# Patient Record
Sex: Male | Born: 1955 | Race: White | Hispanic: No | Marital: Single | Smoking: Never smoker
Health system: Southern US, Community
[De-identification: ages and names within clinical notes are randomized; demographics above are authoritative.]

## PROBLEM LIST (undated history)

## (undated) DIAGNOSIS — I10 Essential (primary) hypertension: Secondary | ICD-10-CM

## (undated) HISTORY — PX: KNEE SURGERY: SHX244

---

## 2007-10-27 ENCOUNTER — Ambulatory Visit (HOSPITAL_COMMUNITY): Admission: RE | Admit: 2007-10-27 | Discharge: 2007-10-27 | Payer: Self-pay | Admitting: Urology

## 2007-12-08 ENCOUNTER — Ambulatory Visit (HOSPITAL_COMMUNITY): Admission: RE | Admit: 2007-12-08 | Discharge: 2007-12-08 | Payer: Self-pay | Admitting: Urology

## 2012-12-17 ENCOUNTER — Other Ambulatory Visit (HOSPITAL_COMMUNITY): Payer: Self-pay | Admitting: Urology

## 2012-12-17 DIAGNOSIS — N200 Calculus of kidney: Secondary | ICD-10-CM

## 2012-12-21 ENCOUNTER — Ambulatory Visit (HOSPITAL_COMMUNITY)
Admission: RE | Admit: 2012-12-21 | Discharge: 2012-12-21 | Disposition: A | Discharge status: Home / Self Care | Payer: BC Managed Care – PPO | Source: Ambulatory Visit | Attending: Urology | Admitting: Urology

## 2012-12-21 DIAGNOSIS — R911 Solitary pulmonary nodule: Secondary | ICD-10-CM | POA: Insufficient documentation

## 2012-12-21 DIAGNOSIS — N200 Calculus of kidney: Secondary | ICD-10-CM

## 2012-12-21 DIAGNOSIS — K7689 Other specified diseases of liver: Secondary | ICD-10-CM | POA: Insufficient documentation

## 2012-12-21 DIAGNOSIS — R1032 Left lower quadrant pain: Secondary | ICD-10-CM | POA: Insufficient documentation

## 2016-01-06 ENCOUNTER — Encounter (HOSPITAL_COMMUNITY): Payer: Self-pay | Admitting: *Deleted

## 2016-01-06 ENCOUNTER — Emergency Department (HOSPITAL_COMMUNITY): Payer: BLUE CROSS/BLUE SHIELD

## 2016-01-06 DIAGNOSIS — S63280A Dislocation of proximal interphalangeal joint of right index finger, initial encounter: Secondary | ICD-10-CM | POA: Diagnosis not present

## 2016-01-06 DIAGNOSIS — Y998 Other external cause status: Secondary | ICD-10-CM | POA: Diagnosis not present

## 2016-01-06 DIAGNOSIS — S76111A Strain of right quadriceps muscle, fascia and tendon, initial encounter: Secondary | ICD-10-CM | POA: Diagnosis not present

## 2016-01-06 DIAGNOSIS — S6991XA Unspecified injury of right wrist, hand and finger(s), initial encounter: Secondary | ICD-10-CM | POA: Diagnosis present

## 2016-01-06 DIAGNOSIS — Y9351 Activity, roller skating (inline) and skateboarding: Secondary | ICD-10-CM | POA: Insufficient documentation

## 2016-01-06 DIAGNOSIS — S20411A Abrasion of right back wall of thorax, initial encounter: Secondary | ICD-10-CM | POA: Insufficient documentation

## 2016-01-06 DIAGNOSIS — S61411A Laceration without foreign body of right hand, initial encounter: Secondary | ICD-10-CM | POA: Insufficient documentation

## 2016-01-06 DIAGNOSIS — Y92828 Other wilderness area as the place of occurrence of the external cause: Secondary | ICD-10-CM | POA: Diagnosis not present

## 2016-01-06 DIAGNOSIS — I10 Essential (primary) hypertension: Secondary | ICD-10-CM | POA: Insufficient documentation

## 2016-01-06 NOTE — ED Triage Notes (Signed)
Pt was riding a skateboard tonight when he fell, did have helmet on, c/o pain to right index finger, upper back area, abrasions noted to left lower leg, bilateral elbows, right upper thigh area, denies any LOC<

## 2016-01-07 ENCOUNTER — Emergency Department (HOSPITAL_COMMUNITY): Payer: BLUE CROSS/BLUE SHIELD

## 2016-01-07 ENCOUNTER — Emergency Department (HOSPITAL_COMMUNITY)
Admission: EM | Admit: 2016-01-07 | Discharge: 2016-01-07 | Disposition: A | Discharge status: Home / Self Care | Payer: BLUE CROSS/BLUE SHIELD | Attending: Emergency Medicine | Admitting: Emergency Medicine

## 2016-01-07 DIAGNOSIS — S20411A Abrasion of right back wall of thorax, initial encounter: Secondary | ICD-10-CM

## 2016-01-07 DIAGNOSIS — S61411A Laceration without foreign body of right hand, initial encounter: Secondary | ICD-10-CM

## 2016-01-07 DIAGNOSIS — S76111A Strain of right quadriceps muscle, fascia and tendon, initial encounter: Secondary | ICD-10-CM

## 2016-01-07 DIAGNOSIS — S63259A Unspecified dislocation of unspecified finger, initial encounter: Secondary | ICD-10-CM

## 2016-01-07 DIAGNOSIS — W19XXXA Unspecified fall, initial encounter: Secondary | ICD-10-CM

## 2016-01-07 HISTORY — DX: Essential (primary) hypertension: I10

## 2016-01-07 MED ORDER — CYCLOBENZAPRINE HCL 10 MG PO TABS
5.0000 mg | ORAL_TABLET | Freq: Once | ORAL | Status: AC
  Administered 2016-01-07: 5 mg via ORAL
  Filled 2016-01-07: qty 1

## 2016-01-07 MED ORDER — NAPROXEN 500 MG PO TABS: 500.0000 mg | ORAL_TABLET | Freq: Two times a day (BID) | ORAL | 0 refills | Status: AC

## 2016-01-07 MED ORDER — BUPIVACAINE HCL (PF) 0.5 % IJ SOLN
10.0000 mL | Freq: Once | INTRAMUSCULAR | Status: DC
  Filled 2016-01-07: qty 30

## 2016-01-07 MED ORDER — HYDROCODONE-ACETAMINOPHEN 5-325 MG PO TABS: 1.0000 | ORAL_TABLET | Freq: Four times a day (QID) | ORAL | 0 refills | Status: AC | PRN

## 2016-01-07 MED ORDER — NAPROXEN 250 MG PO TABS
500.0000 mg | ORAL_TABLET | Freq: Once | ORAL | Status: AC
  Administered 2016-01-07: 500 mg via ORAL
  Filled 2016-01-07: qty 2

## 2016-01-07 MED ORDER — CEPHALEXIN 500 MG PO CAPS: 500.0000 mg | ORAL_CAPSULE | Freq: Three times a day (TID) | ORAL | 0 refills | Status: AC

## 2016-01-07 MED ORDER — CYCLOBENZAPRINE HCL 5 MG PO TABS
5.0000 mg | ORAL_TABLET | Freq: Three times a day (TID) | ORAL | 0 refills | Status: AC | PRN
Start: 2016-01-07 — End: ?

## 2016-01-07 MED ORDER — CEPHALEXIN 250 MG/5ML PO SUSR
500.0000 mg | Freq: Once | ORAL | Status: AC
  Administered 2016-01-07: 500 mg via ORAL
  Filled 2016-01-07: qty 20

## 2016-01-07 NOTE — ED Provider Notes (Signed)
[facility]Y DEPT Provider Note   CSN: [id number] Arrival date & time: 01/06/16  2301  By signing my name below, I, Derrick Bean, attest that this documentation has been prepared under the direction and in the presence of Derrick Albe, MD . Electronically Signed: Modena Bean, Scribe. 01/07/2016. 12:16 AM.  History   Chief Complaint Chief Complaint  Patient presents with  . Fall   The history is provided by the patient. No language interpreter was used.   HPI Comments: Derrick Bean is a 60 y.o. male who presents to the Emergency Department complaining of a fall that occurred today. He states that fell off a skateboard while riding down a hill with a helmet on. He couldn't figure out how to stop and started gaining speed because he was going downhill.  He reports injuries on multiple areas of his body, including his right index finger, laceration of his right palm, low back stiffness, right thigh pain. He denies any LOC. He states he has constant moderate RIF, Right thigh pain.  He denies any neck pain, abdominal pain.    PCP: Derrick Sprung, MD  Past Medical History:  Diagnosis Date  . Hypertension     There are no active problems to display for this patient.   Past Surgical History:  Procedure Laterality Date  . KNEE SURGERY         Home Medications    lisinopril  Family History No family history on file.  Social History Social History  Substance Use Topics  . Smoking status: Never Smoker  . Smokeless tobacco: Never Used  . Alcohol use No  retired   Allergies   Review of patient's allergies indicates no known allergies.   Review of Systems Review of Systems  Musculoskeletal: Positive for back pain (Upper). Negative for neck pain.  Skin: Positive for wound.  Neurological: Negative for syncope.  All other systems reviewed and are negative.    Physical Exam Updated Vital Signs BP 127/79   Pulse 81   Temp 97.8 F (36.6 C) (Oral)   Resp 20   Ht  6\' 4"  (1.93 m)   Wt 220 lb (99.8 kg)   SpO2 97%   BMI 26.78 kg/m   Vital signs normal    Physical Exam  Constitutional: He is oriented to person, place, and time. He appears well-developed and well-nourished.  Non-toxic appearance. He does not appear ill. No distress.  HENT:  Head: Normocephalic and atraumatic.  Right Ear: External ear normal.  Left Ear: External ear normal.  Nose: Nose normal. No mucosal edema or rhinorrhea.  Mouth/Throat: Oropharynx is clear and moist and mucous membranes are normal. No dental abscesses or uvula swelling.  Eyes: Conjunctivae and EOM are normal. Pupils are equal, round, and reactive to light.  Neck: Normal range of motion and full passive range of motion without pain. Neck supple.  Cardiovascular: Normal rate, regular rhythm and normal heart sounds.  Exam reveals no gallop and no friction rub.   No murmur heard. Pulmonary/Chest: Effort normal and breath sounds normal. No respiratory distress. He has no wheezes. He has no rhonchi. He has no rales. He exhibits no tenderness and no crepitus.  His chest is nontender when I stress his ribs.  Abdominal: Soft. Normal appearance. He exhibits no distension. There is no tenderness.  Musculoskeletal: Normal range of motion. He exhibits tenderness. He exhibits no edema.       Back:       Arms:      Right  hand: He exhibits deformity.       Legs: Deformity of the RIF at the PIP joint. Abrasion over proimal left forearm and proximal lateral LLE, but ROM of joints without pain.  His back is nontender to palpation midline thoracic or lumbar or cervical spine. He has an abrasion as noted however he is nontender to palpation in that area.  Neurological: He is alert and oriented to person, place, and time. He has normal strength. No cranial nerve deficit.  Skin: Skin is warm, dry and intact. No rash noted. No erythema. No pallor.  Abrasions across the lower thoracic area.   1 cm laceration on his right palm around  the MCP of the RIF. Laceration on the volar aspect of right index proximal to the PIP crease.   Psychiatric: He has a normal mood and affect. His speech is normal and behavior is normal. His mood appears not anxious.  Nursing note and vitals reviewed.      ED Treatments / Results      Radiology Dg Hand Complete Right  Result Date: 01/07/2016 CLINICAL DATA:  Skateboarding accident tonight, second finger pain. EXAM: RIGHT HAND - COMPLETE 3+ VIEW COMPARISON:  None. FINDINGS: Dislocation second proximal interphalangeal joint, the middle phalanx is posteriorly displaced with at least 10 mm of overriding. No fracture deformity. No destructive bony lesions. Second digit soft tissue swelling without subcutaneous gas or radiopaque foreign bodies. IMPRESSION: Acute second PIP dislocation without fracture deformity. Electronically Signed   By: Derrick Metroourtnay  Bean M.D.   On: 01/07/2016 00:10   Dg Finger Index Right  Result Date: 01/07/2016 CLINICAL DATA:  Index finger dislocation, postreduction. EXAM: RIGHT INDEX FINGER 2+V COMPARISON:  Pre reduction exam 1 our prior. FINDINGS: Proximal interphalangeal joint dislocation has been reduced. There is minimal residual dorsal shift of the middle phalanx with respect to the proximal phalanx with mild extension. A 3 mm osseous fragment adjacent to the volar aspect of the distal phalanx. This may be a fracture fragment with donor site likely from the middle phalanx versus accessory ossicle. There is soft tissue edema. IMPRESSION: Reduction of index finger PIP dislocation. Small osseous fragment adjacent to the volar articular surface may be a small fracture fragment, with donor site likely from the middle phalanx versus accessory ossicle. Electronically Signed   By: Derrick OaksMelanie  Bean M.D.   On: 01/07/2016 01:02    Procedures Procedures (including critical care time)  Medications Ordered in ED Medications  bupivacaine (MARCAINE) 0.5 % injection 10 mL (not  administered)  naproxen (NAPROSYN) tablet 500 mg (not administered)  cyclobenzaprine (FLEXERIL) tablet 5 mg (not administered)  cephALEXin (KEFLEX) 250 MG/5ML suspension 500 mg (500 mg Oral Given 01/07/16 0106)     Initial Impression / Assessment and Plan / ED Course  I have reviewed the triage vital signs and the nursing notes.  Pertinent labs & imaging results that were available during my care of the patient were reviewed by me and considered in my medical decision making (see chart for details).  Clinical Course   12:42 AM discussed His x-ray which showed a dislocation at the PIP joint of his right index finger. A digital block was performed using Marcaine and 0.5%.  Returned at 12:48 AM and patient's dislocated finger was reduced. He was sent for postreduction x-ray.  Patient's lacerations were sutured by my PA. He was started on Keflex because the dislocation could be opened. After he was sutured he was placed in a finger splint. He was referred to orthopedics.  He was started on anti-inflammatory and muscle relaxers for his general body stiffness and achiness. He was given Norco for severe pain from his dislocation.  NERVE BLOCK Performed by: Ward Givens Consent: Verbal consent obtained. Required items: required blood products, implants, devices, and special equipment available Time out: Immediately prior to procedure a "time out" was called to verify the correct patient, procedure, equipment, support staff and site/side marked as required.  Indication: pain control for procedure Nerve block body site: RIF  Preparation: Patient was prepped and draped in the usual sterile fashion. Needle gauge: 24 G Location technique: anatomical landmarks  Local anesthetic: 0.5% marcaine   Anesthetic total: 4 ml  Outcome: pain improved Patient tolerance: Patient tolerated the procedure well with no immediate complications.   Reduction of dislocation Date/Time: 12:48 AM Performed by:  Ward Givens Authorized by: Ward Givens Consent: Verbal consent obtained. Risks and benefits: risks, benefits and alternatives were discussed Consent given by: patient Required items: required blood products, implants, devices, and special equipment available Time out: Immediately prior to procedure a "time out" was called to verify the correct patient, procedure, equipment, support staff and site/side marked as required.   Vitals: Vital signs were monitored during sedation. Patient tolerance: Patient tolerated the procedure well with no immediate complications. Joint: PIPJ of the RIF Reduction technique: traction    Final Clinical Impressions(s) / ED Diagnoses   Final diagnoses:  Fall, initial encounter  Laceration of right palm, initial encounter  Dislocation, finger, initial encounter  Abrasion of upper back excluding scapular region, right, initial encounter  Quadriceps muscle strain, right, initial encounter    New Prescriptions New Prescriptions   CEPHALEXIN (KEFLEX) 500 MG CAPSULE    Take 1 capsule (500 mg total) by mouth 3 (three) times daily.   CYCLOBENZAPRINE (FLEXERIL) 5 MG TABLET    Take 1 tablet (5 mg total) by mouth 3 (three) times daily as needed.   HYDROCODONE-ACETAMINOPHEN (NORCO/VICODIN) 5-325 MG TABLET    Take 1 tablet by mouth every 6 (six) hours as needed for moderate pain or severe pain.   NAPROXEN (NAPROSYN) 500 MG TABLET    Take 1 tablet (500 mg total) by mouth 2 (two) times daily.   Plan discharge  Derrick Albe, MD, FACEP    I personally performed the services described in this documentation, which was scribed in my presence. The recorded information has been reviewed and considered.  Derrick Albe, MD, Concha Pyo, MD 01/07/16 [phone]

## 2016-01-07 NOTE — Discharge Instructions (Addendum)
Elevate the injured areas. Use ice to keep the swelling down and for comfort. Take the medications as prescribed. Call either Dr Harrison's office or Dr Mina MarbleWeingold (hand [place] call in [place]) to be rechecked early this week. Keep the lacerations clean and dry. You can use triple antibiotic ointment on the wounds. Wear the splint.  Recheck if you see signs of infection in either wound, or you seem worse.

## 2016-01-07 NOTE — ED Provider Notes (Signed)
LACERATION REPAIR OF THE RIGHT INDEX FINGER AND THE RIGHT PALM.  Patient is a 60 year old male who fell off off of a skateboard and sustained a laceration to his right index finger and right hand. I discussed the procedure of repairing the lacerations with the patient in terms which he understands. And he has permission for the procedure.  Patient was identified by arm band. Procedural time out taken before starting the procedure.  The hand was cleansed with safe-cleanse. The laceration of the palmar surface of the right hand was infiltrated with Sensorcaine 0.5%. The wound was irrigated with saline. No foreign body appreciated. No bone or tendon involvement was noted. Following this the wound was repaired using sterile technique with 2 interrupted sutures of 4-0 nylon. The wound measures 1.3 cm.  Sterile field was then used at the right index finger. The wound was again cleansed with safe-cleanse. This site was already anesthetized because of a dislocation of the finger. The wound was explored, it was irrigated with saline. There is no bone or tendon involvement. No foreign body noted. Wound was repaired with 4 interrupted sutures of 4-0 nylon. The patient tolerated the procedure without problem. Dressing and splint were applied by the nursing staff.   Ivery QualeHobson Donnie Panik, PA-C 01/07/16 [id number]    Devoria AlbeIva Knapp, MD 01/07/16 [phone]

## 2016-01-07 NOTE — ED Notes (Signed)
MD at bedside. 

## 2017-04-13 IMAGING — DX DG HAND COMPLETE 3+V*R*
3 series · 3 of 3 positions shown · non-contrast
Comparison: None.

CLINICAL DATA: Skateboarding accident tonight, second finger pain.

EXAM:
RIGHT HAND - COMPLETE 3+ VIEW

[hand pa]
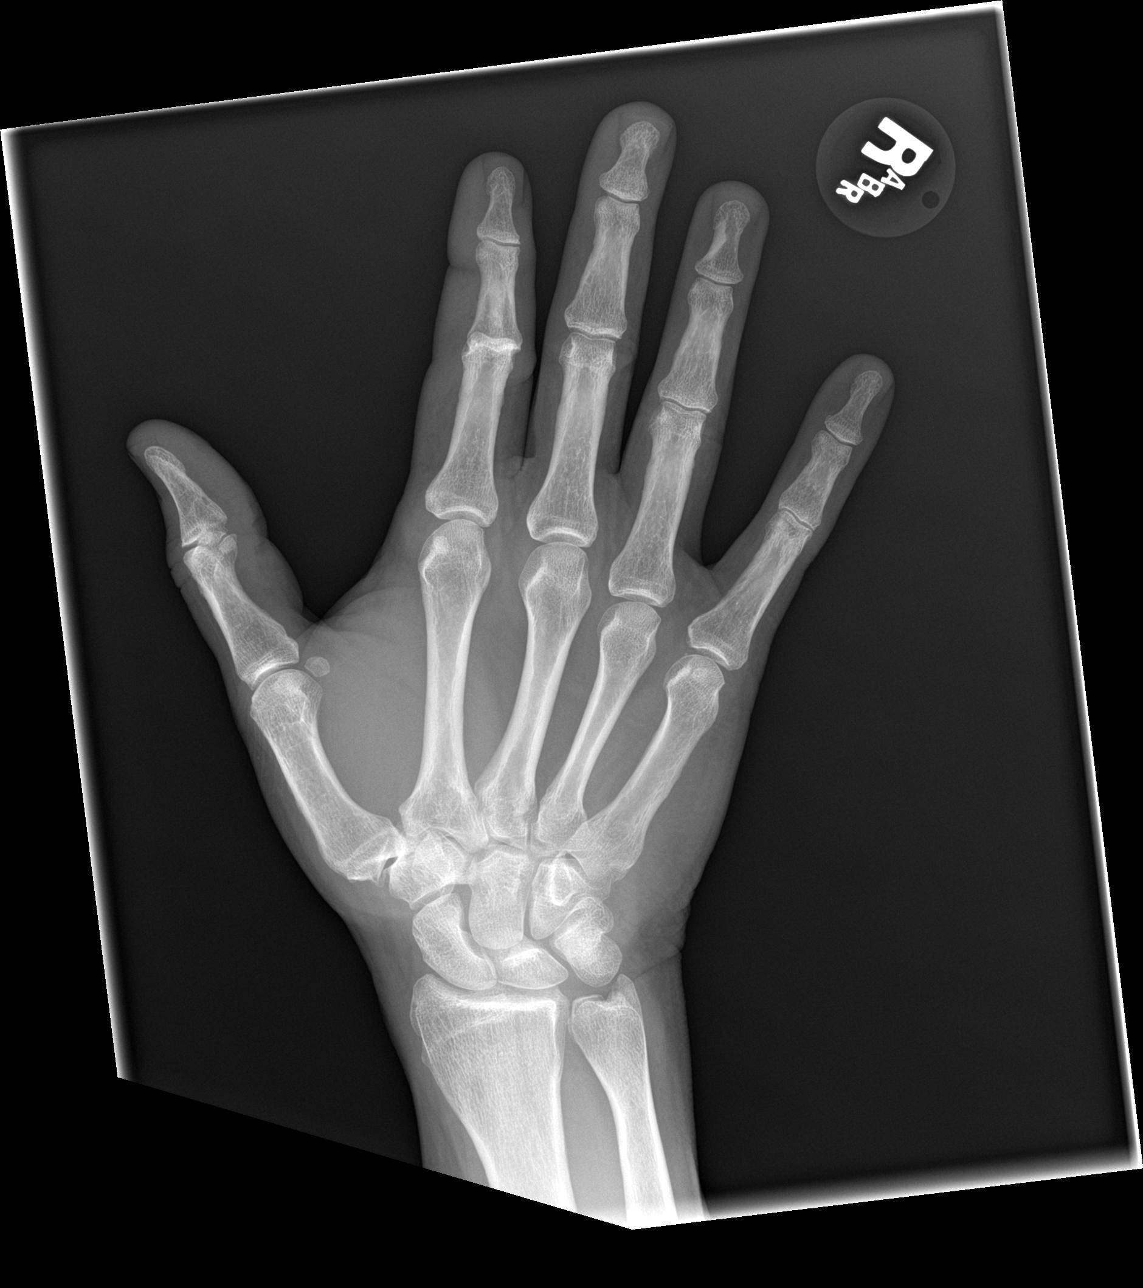

[hand obl]
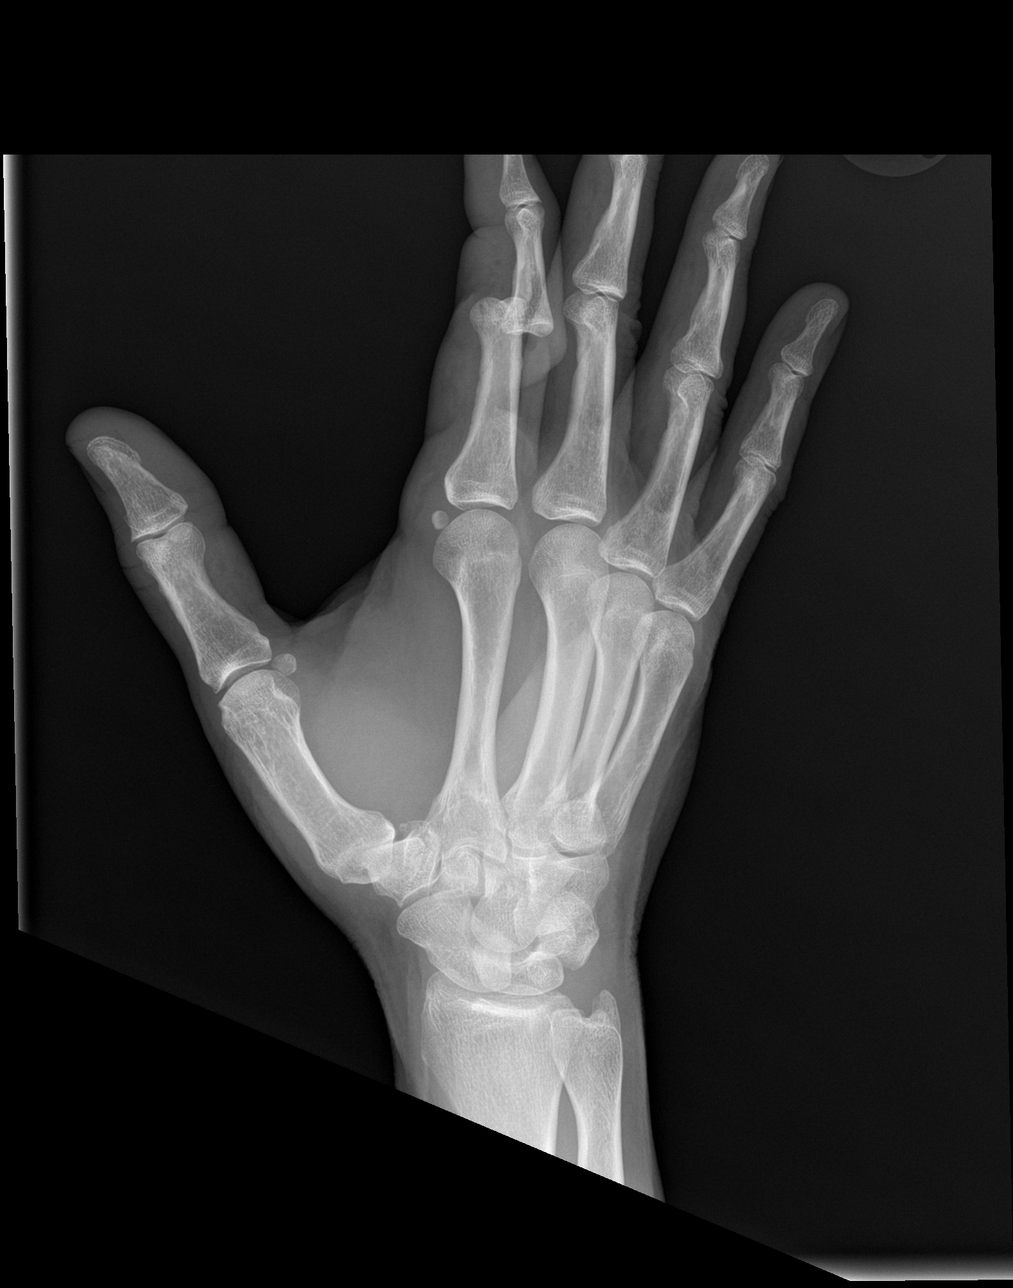

[hand lat]
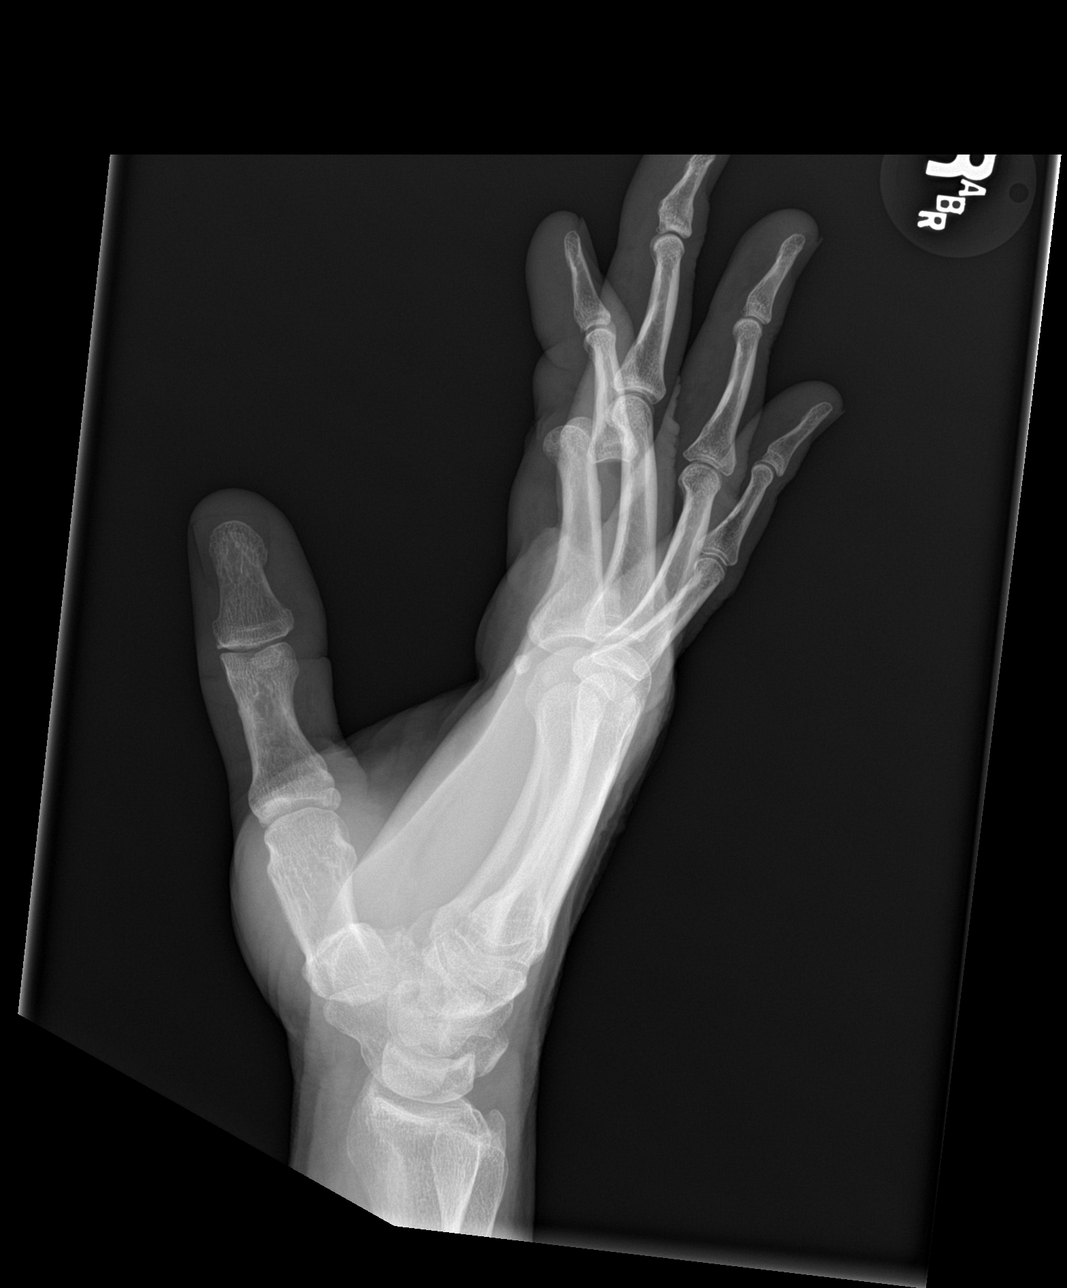

[3 of 3 positions shown; findings below may reference images not displayed]

FINDINGS: Dislocation second proximal interphalangeal joint, the middle
phalanx is posteriorly displaced with at least 10 mm of overriding.
No fracture deformity. No destructive bony lesions. Second digit
soft tissue swelling without subcutaneous gas or radiopaque foreign
bodies.
IMPRESSION: Acute second PIP dislocation without fracture deformity.

## 2019-04-12 DIAGNOSIS — I10 Essential (primary) hypertension: Secondary | ICD-10-CM | POA: Diagnosis not present

## 2019-04-12 DIAGNOSIS — R42 Dizziness and giddiness: Secondary | ICD-10-CM | POA: Diagnosis not present

## 2019-04-12 DIAGNOSIS — K900 Celiac disease: Secondary | ICD-10-CM | POA: Diagnosis not present

## 2019-04-12 DIAGNOSIS — R7301 Impaired fasting glucose: Secondary | ICD-10-CM | POA: Diagnosis not present

## 2019-04-12 DIAGNOSIS — E8881 Metabolic syndrome: Secondary | ICD-10-CM | POA: Diagnosis not present

## 2019-04-12 DIAGNOSIS — R946 Abnormal results of thyroid function studies: Secondary | ICD-10-CM | POA: Diagnosis not present

## 2019-04-12 DIAGNOSIS — R739 Hyperglycemia, unspecified: Secondary | ICD-10-CM | POA: Diagnosis not present

## 2019-05-04 DIAGNOSIS — L570 Actinic keratosis: Secondary | ICD-10-CM | POA: Diagnosis not present

## 2019-12-20 DIAGNOSIS — M7732 Calcaneal spur, left foot: Secondary | ICD-10-CM | POA: Diagnosis not present

## 2019-12-20 DIAGNOSIS — M71572 Other bursitis, not elsewhere classified, left ankle and foot: Secondary | ICD-10-CM | POA: Diagnosis not present

## 2019-12-20 DIAGNOSIS — M722 Plantar fascial fibromatosis: Secondary | ICD-10-CM | POA: Diagnosis not present

## 2019-12-27 DIAGNOSIS — M71572 Other bursitis, not elsewhere classified, left ankle and foot: Secondary | ICD-10-CM | POA: Diagnosis not present

## 2020-01-03 DIAGNOSIS — M71572 Other bursitis, not elsewhere classified, left ankle and foot: Secondary | ICD-10-CM | POA: Diagnosis not present

## 2020-01-03 DIAGNOSIS — M722 Plantar fascial fibromatosis: Secondary | ICD-10-CM | POA: Diagnosis not present

## 2020-01-12 DIAGNOSIS — M722 Plantar fascial fibromatosis: Secondary | ICD-10-CM | POA: Diagnosis not present

## 2020-01-12 DIAGNOSIS — M71572 Other bursitis, not elsewhere classified, left ankle and foot: Secondary | ICD-10-CM | POA: Diagnosis not present

## 2020-01-18 DIAGNOSIS — M722 Plantar fascial fibromatosis: Secondary | ICD-10-CM | POA: Diagnosis not present

## 2020-01-18 DIAGNOSIS — M71571 Other bursitis, not elsewhere classified, right ankle and foot: Secondary | ICD-10-CM | POA: Diagnosis not present

## 2020-04-11 DIAGNOSIS — M722 Plantar fascial fibromatosis: Secondary | ICD-10-CM | POA: Diagnosis not present

## 2020-04-17 DIAGNOSIS — Z13220 Encounter for screening for lipoid disorders: Secondary | ICD-10-CM | POA: Diagnosis not present

## 2020-04-17 DIAGNOSIS — R7301 Impaired fasting glucose: Secondary | ICD-10-CM | POA: Diagnosis not present

## 2020-04-17 DIAGNOSIS — Z1329 Encounter for screening for other suspected endocrine disorder: Secondary | ICD-10-CM | POA: Diagnosis not present

## 2020-04-17 DIAGNOSIS — E8881 Metabolic syndrome: Secondary | ICD-10-CM | POA: Diagnosis not present

## 2020-04-17 DIAGNOSIS — I10 Essential (primary) hypertension: Secondary | ICD-10-CM | POA: Diagnosis not present

## 2020-04-17 DIAGNOSIS — Z125 Encounter for screening for malignant neoplasm of prostate: Secondary | ICD-10-CM | POA: Diagnosis not present

## 2020-04-17 DIAGNOSIS — Z8249 Family history of ischemic heart disease and other diseases of the circulatory system: Secondary | ICD-10-CM | POA: Diagnosis not present

## 2020-04-20 DIAGNOSIS — Z0001 Encounter for general adult medical examination with abnormal findings: Secondary | ICD-10-CM | POA: Diagnosis not present

## 2020-04-20 DIAGNOSIS — I10 Essential (primary) hypertension: Secondary | ICD-10-CM | POA: Diagnosis not present

## 2020-04-20 DIAGNOSIS — Z6831 Body mass index (BMI) 31.0-31.9, adult: Secondary | ICD-10-CM | POA: Diagnosis not present

## 2020-04-20 DIAGNOSIS — R7301 Impaired fasting glucose: Secondary | ICD-10-CM | POA: Diagnosis not present

## 2020-04-20 DIAGNOSIS — Z23 Encounter for immunization: Secondary | ICD-10-CM | POA: Diagnosis not present

## 2020-04-20 DIAGNOSIS — E8881 Metabolic syndrome: Secondary | ICD-10-CM | POA: Diagnosis not present

## 2020-04-20 DIAGNOSIS — N1831 Chronic kidney disease, stage 3a: Secondary | ICD-10-CM | POA: Diagnosis not present

## 2020-04-20 DIAGNOSIS — M722 Plantar fascial fibromatosis: Secondary | ICD-10-CM | POA: Diagnosis not present

## 2020-05-16 DIAGNOSIS — Z23 Encounter for immunization: Secondary | ICD-10-CM | POA: Diagnosis not present

## 2020-06-15 DIAGNOSIS — Z6830 Body mass index (BMI) 30.0-30.9, adult: Secondary | ICD-10-CM | POA: Diagnosis not present

## 2020-06-15 DIAGNOSIS — R42 Dizziness and giddiness: Secondary | ICD-10-CM | POA: Diagnosis not present

## 2020-06-22 DIAGNOSIS — Z6831 Body mass index (BMI) 31.0-31.9, adult: Secondary | ICD-10-CM | POA: Diagnosis not present

## 2020-06-22 DIAGNOSIS — H8110 Benign paroxysmal vertigo, unspecified ear: Secondary | ICD-10-CM | POA: Diagnosis not present

## 2020-06-30 DIAGNOSIS — H8110 Benign paroxysmal vertigo, unspecified ear: Secondary | ICD-10-CM | POA: Diagnosis not present

## 2020-06-30 DIAGNOSIS — Z6830 Body mass index (BMI) 30.0-30.9, adult: Secondary | ICD-10-CM | POA: Diagnosis not present

## 2020-07-12 DIAGNOSIS — R2689 Other abnormalities of gait and mobility: Secondary | ICD-10-CM | POA: Diagnosis not present

## 2020-07-12 DIAGNOSIS — R42 Dizziness and giddiness: Secondary | ICD-10-CM | POA: Diagnosis not present

## 2020-07-20 DIAGNOSIS — M62830 Muscle spasm of back: Secondary | ICD-10-CM | POA: Diagnosis not present

## 2020-07-20 DIAGNOSIS — M9902 Segmental and somatic dysfunction of thoracic region: Secondary | ICD-10-CM | POA: Diagnosis not present

## 2020-07-20 DIAGNOSIS — M9900 Segmental and somatic dysfunction of head region: Secondary | ICD-10-CM | POA: Diagnosis not present

## 2020-07-20 DIAGNOSIS — M9901 Segmental and somatic dysfunction of cervical region: Secondary | ICD-10-CM | POA: Diagnosis not present

## 2020-07-24 DIAGNOSIS — M9900 Segmental and somatic dysfunction of head region: Secondary | ICD-10-CM | POA: Diagnosis not present

## 2020-07-24 DIAGNOSIS — M9901 Segmental and somatic dysfunction of cervical region: Secondary | ICD-10-CM | POA: Diagnosis not present

## 2020-07-24 DIAGNOSIS — M62830 Muscle spasm of back: Secondary | ICD-10-CM | POA: Diagnosis not present

## 2020-07-24 DIAGNOSIS — M9902 Segmental and somatic dysfunction of thoracic region: Secondary | ICD-10-CM | POA: Diagnosis not present

## 2020-07-31 DIAGNOSIS — M47812 Spondylosis without myelopathy or radiculopathy, cervical region: Secondary | ICD-10-CM | POA: Diagnosis not present

## 2020-07-31 DIAGNOSIS — M9901 Segmental and somatic dysfunction of cervical region: Secondary | ICD-10-CM | POA: Diagnosis not present

## 2020-07-31 DIAGNOSIS — R42 Dizziness and giddiness: Secondary | ICD-10-CM | POA: Diagnosis not present

## 2020-08-01 DIAGNOSIS — R42 Dizziness and giddiness: Secondary | ICD-10-CM | POA: Diagnosis not present

## 2020-08-01 DIAGNOSIS — M9901 Segmental and somatic dysfunction of cervical region: Secondary | ICD-10-CM | POA: Diagnosis not present

## 2020-08-01 DIAGNOSIS — M47812 Spondylosis without myelopathy or radiculopathy, cervical region: Secondary | ICD-10-CM | POA: Diagnosis not present

## 2020-08-02 DIAGNOSIS — H81399 Other peripheral vertigo, unspecified ear: Secondary | ICD-10-CM | POA: Diagnosis not present

## 2020-08-03 DIAGNOSIS — M9901 Segmental and somatic dysfunction of cervical region: Secondary | ICD-10-CM | POA: Diagnosis not present

## 2020-08-03 DIAGNOSIS — R42 Dizziness and giddiness: Secondary | ICD-10-CM | POA: Diagnosis not present

## 2020-08-03 DIAGNOSIS — M47812 Spondylosis without myelopathy or radiculopathy, cervical region: Secondary | ICD-10-CM | POA: Diagnosis not present

## 2020-08-07 DIAGNOSIS — Z6831 Body mass index (BMI) 31.0-31.9, adult: Secondary | ICD-10-CM | POA: Diagnosis not present

## 2020-08-07 DIAGNOSIS — R42 Dizziness and giddiness: Secondary | ICD-10-CM | POA: Diagnosis not present

## 2020-08-07 DIAGNOSIS — I10 Essential (primary) hypertension: Secondary | ICD-10-CM | POA: Diagnosis not present

## 2020-08-08 DIAGNOSIS — R42 Dizziness and giddiness: Secondary | ICD-10-CM | POA: Diagnosis not present

## 2020-08-08 DIAGNOSIS — M9901 Segmental and somatic dysfunction of cervical region: Secondary | ICD-10-CM | POA: Diagnosis not present

## 2020-08-08 DIAGNOSIS — M47812 Spondylosis without myelopathy or radiculopathy, cervical region: Secondary | ICD-10-CM | POA: Diagnosis not present

## 2020-08-15 DIAGNOSIS — M47812 Spondylosis without myelopathy or radiculopathy, cervical region: Secondary | ICD-10-CM | POA: Diagnosis not present

## 2020-08-15 DIAGNOSIS — R42 Dizziness and giddiness: Secondary | ICD-10-CM | POA: Diagnosis not present

## 2020-08-15 DIAGNOSIS — M9901 Segmental and somatic dysfunction of cervical region: Secondary | ICD-10-CM | POA: Diagnosis not present

## 2020-10-10 DIAGNOSIS — I10 Essential (primary) hypertension: Secondary | ICD-10-CM | POA: Diagnosis not present

## 2020-10-10 DIAGNOSIS — N1831 Chronic kidney disease, stage 3a: Secondary | ICD-10-CM | POA: Diagnosis not present

## 2020-10-10 DIAGNOSIS — Z6831 Body mass index (BMI) 31.0-31.9, adult: Secondary | ICD-10-CM | POA: Diagnosis not present

## 2020-10-10 DIAGNOSIS — R7301 Impaired fasting glucose: Secondary | ICD-10-CM | POA: Diagnosis not present

## 2020-10-10 DIAGNOSIS — E8881 Metabolic syndrome: Secondary | ICD-10-CM | POA: Diagnosis not present

## 2021-04-24 DIAGNOSIS — Z9189 Other specified personal risk factors, not elsewhere classified: Secondary | ICD-10-CM | POA: Diagnosis not present

## 2021-04-24 DIAGNOSIS — R7301 Impaired fasting glucose: Secondary | ICD-10-CM | POA: Diagnosis not present

## 2021-04-24 DIAGNOSIS — Z6832 Body mass index (BMI) 32.0-32.9, adult: Secondary | ICD-10-CM | POA: Diagnosis not present

## 2021-04-24 DIAGNOSIS — I10 Essential (primary) hypertension: Secondary | ICD-10-CM | POA: Diagnosis not present

## 2021-04-24 DIAGNOSIS — Z1212 Encounter for screening for malignant neoplasm of rectum: Secondary | ICD-10-CM | POA: Diagnosis not present

## 2021-04-24 DIAGNOSIS — Z0001 Encounter for general adult medical examination with abnormal findings: Secondary | ICD-10-CM | POA: Diagnosis not present

## 2021-04-24 DIAGNOSIS — N1831 Chronic kidney disease, stage 3a: Secondary | ICD-10-CM | POA: Diagnosis not present

## 2021-04-24 DIAGNOSIS — Z23 Encounter for immunization: Secondary | ICD-10-CM | POA: Diagnosis not present

## 2021-04-24 DIAGNOSIS — E8881 Metabolic syndrome: Secondary | ICD-10-CM | POA: Diagnosis not present

## 2021-10-23 DIAGNOSIS — I10 Essential (primary) hypertension: Secondary | ICD-10-CM | POA: Diagnosis not present

## 2021-10-23 DIAGNOSIS — R7301 Impaired fasting glucose: Secondary | ICD-10-CM | POA: Diagnosis not present

## 2021-10-23 DIAGNOSIS — N1831 Chronic kidney disease, stage 3a: Secondary | ICD-10-CM | POA: Diagnosis not present

## 2021-10-23 DIAGNOSIS — Z6831 Body mass index (BMI) 31.0-31.9, adult: Secondary | ICD-10-CM | POA: Diagnosis not present

## 2021-10-23 DIAGNOSIS — E8881 Metabolic syndrome: Secondary | ICD-10-CM | POA: Diagnosis not present

## 2022-04-24 DIAGNOSIS — N1831 Chronic kidney disease, stage 3a: Secondary | ICD-10-CM | POA: Diagnosis not present

## 2022-04-24 DIAGNOSIS — Z13220 Encounter for screening for lipoid disorders: Secondary | ICD-10-CM | POA: Diagnosis not present

## 2022-04-24 DIAGNOSIS — Z1329 Encounter for screening for other suspected endocrine disorder: Secondary | ICD-10-CM | POA: Diagnosis not present

## 2022-04-24 DIAGNOSIS — Z125 Encounter for screening for malignant neoplasm of prostate: Secondary | ICD-10-CM | POA: Diagnosis not present

## 2022-04-24 DIAGNOSIS — I10 Essential (primary) hypertension: Secondary | ICD-10-CM | POA: Diagnosis not present

## 2022-04-24 DIAGNOSIS — R7301 Impaired fasting glucose: Secondary | ICD-10-CM | POA: Diagnosis not present

## 2022-04-29 DIAGNOSIS — I10 Essential (primary) hypertension: Secondary | ICD-10-CM | POA: Diagnosis not present

## 2022-04-29 DIAGNOSIS — E8881 Metabolic syndrome: Secondary | ICD-10-CM | POA: Diagnosis not present

## 2022-04-29 DIAGNOSIS — R7301 Impaired fasting glucose: Secondary | ICD-10-CM | POA: Diagnosis not present

## 2022-04-29 DIAGNOSIS — Z23 Encounter for immunization: Secondary | ICD-10-CM | POA: Diagnosis not present

## 2022-04-29 DIAGNOSIS — Z6832 Body mass index (BMI) 32.0-32.9, adult: Secondary | ICD-10-CM | POA: Diagnosis not present

## 2022-04-29 DIAGNOSIS — Z0001 Encounter for general adult medical examination with abnormal findings: Secondary | ICD-10-CM | POA: Diagnosis not present

## 2022-10-28 DIAGNOSIS — I10 Essential (primary) hypertension: Secondary | ICD-10-CM | POA: Diagnosis not present

## 2022-10-28 DIAGNOSIS — Z13220 Encounter for screening for lipoid disorders: Secondary | ICD-10-CM | POA: Diagnosis not present

## 2022-10-28 DIAGNOSIS — R7301 Impaired fasting glucose: Secondary | ICD-10-CM | POA: Diagnosis not present

## 2022-11-04 DIAGNOSIS — Z6833 Body mass index (BMI) 33.0-33.9, adult: Secondary | ICD-10-CM | POA: Diagnosis not present

## 2022-11-04 DIAGNOSIS — E8881 Metabolic syndrome: Secondary | ICD-10-CM | POA: Diagnosis not present

## 2022-11-04 DIAGNOSIS — I10 Essential (primary) hypertension: Secondary | ICD-10-CM | POA: Diagnosis not present

## 2022-11-04 DIAGNOSIS — R7301 Impaired fasting glucose: Secondary | ICD-10-CM | POA: Diagnosis not present

## 2023-06-18 ENCOUNTER — Encounter (INDEPENDENT_AMBULATORY_CARE_PROVIDER_SITE_OTHER): Payer: Self-pay | Admitting: *Deleted

## 2023-12-16 ENCOUNTER — Encounter (INDEPENDENT_AMBULATORY_CARE_PROVIDER_SITE_OTHER): Payer: Self-pay | Admitting: *Deleted
